# Patient Record
Sex: Male | Born: 2000 | Race: Black or African American | Hispanic: No | Marital: Single | Smoking: Never smoker
Health system: Southern US, Community
[De-identification: ages and names within clinical notes are randomized; demographics above are authoritative.]

## PROBLEM LIST (undated history)

## (undated) DIAGNOSIS — J302 Other seasonal allergic rhinitis: Secondary | ICD-10-CM

---
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
## 2022-04-12 ENCOUNTER — Emergency Department (HOSPITAL_BASED_OUTPATIENT_CLINIC_OR_DEPARTMENT_OTHER): Payer: 59

## 2022-04-12 ENCOUNTER — Emergency Department (HOSPITAL_BASED_OUTPATIENT_CLINIC_OR_DEPARTMENT_OTHER)
Admission: EM | Admit: 2022-04-12 | Discharge: 2022-04-12 | Disposition: A | Discharge status: Home / Self Care | Payer: 59 | Attending: Emergency Medicine | Admitting: Emergency Medicine

## 2022-04-12 ENCOUNTER — Encounter (HOSPITAL_BASED_OUTPATIENT_CLINIC_OR_DEPARTMENT_OTHER): Payer: Self-pay | Admitting: Emergency Medicine

## 2022-04-12 DIAGNOSIS — Y9361 Activity, american tackle football: Secondary | ICD-10-CM | POA: Diagnosis not present

## 2022-04-12 DIAGNOSIS — S63297A Dislocation of distal interphalangeal joint of left little finger, initial encounter: Secondary | ICD-10-CM | POA: Diagnosis not present

## 2022-04-12 DIAGNOSIS — S6992XA Unspecified injury of left wrist, hand and finger(s), initial encounter: Secondary | ICD-10-CM | POA: Diagnosis present

## 2022-04-12 DIAGNOSIS — W2101XA Struck by football, initial encounter: Secondary | ICD-10-CM | POA: Diagnosis not present

## 2022-04-12 DIAGNOSIS — S63259A Unspecified dislocation of unspecified finger, initial encounter: Secondary | ICD-10-CM

## 2022-04-12 MED ORDER — LIDOCAINE HCL 2 % IJ SOLN
20.0000 mL | Freq: Once | INTRAMUSCULAR | Status: AC
  Administered 2022-04-12: 400 mg
  Filled 2022-04-12: qty 20

## 2022-04-12 NOTE — ED Triage Notes (Signed)
Pt injured L pinky finger while playing football today. Pt's L pinky has a deformity.

## 2022-04-12 NOTE — ED Provider Notes (Signed)
[facility] EMERGENCY DEPARTMENT Provider Note   CSN: [id number] Arrival date & time: 04/12/22  1239     History  Chief Complaint  Patient presents with   Finger Injury    Clifford Brooks is a 21 y.o. male who presents to the ED for evaluation of a left pinky injury that occurred all playing football today.  Patient states he was attempting to catch a ball and slammed into his finger.  Pain is rated 7 out of 10.  Deformity to the left pinky.  He denies numbness and tingling.  He has no other complaints.  HPI     Home Medications Prior to Admission medications   Not on File      Allergies    Patient has no known allergies.    Review of Systems   Review of Systems  Physical Exam Updated Vital Signs BP 115/68 (BP Location: Right Arm)   Pulse 80   Temp 98.5 F (36.9 C)   Resp 16   SpO2 99%  Physical Exam Vitals and nursing note reviewed.  Constitutional:      General: He is not in acute distress.    Appearance: He is not ill-appearing.  HENT:     Head: Atraumatic.  Eyes:     Conjunctiva/sclera: Conjunctivae normal.  Cardiovascular:     Rate and Rhythm: Normal rate and regular rhythm.     Pulses: Normal pulses.     Heart sounds: No murmur heard. Pulmonary:     Effort: Pulmonary effort is normal. No respiratory distress.     Breath sounds: Normal breath sounds.  Abdominal:     General: Abdomen is flat. There is no distension.     Palpations: Abdomen is soft.     Tenderness: There is no abdominal tenderness.  Musculoskeletal:        General: Normal range of motion.     Cervical back: Normal range of motion.     Comments: Left pinky with obvious deformity.  Brisk capillary refill.  Subjective sensation intact.  Skin:    General: Skin is warm and dry.     Capillary Refill: Capillary refill takes less than 2 seconds.  Neurological:     General: No focal deficit present.     Mental Status: He is alert.  Psychiatric:        Mood and Affect: Mood  normal.     ED Results / Procedures / Treatments   Labs (all labs ordered are listed, but only abnormal results are displayed) Labs Reviewed - No data to display  EKG None  Radiology DG Finger Little Left  Result Date: 04/12/2022 CLINICAL DATA:  Dislocation, postreduction. EXAM: LEFT LITTLE FINGER 2+V COMPARISON:  Pre reduction radiograph earlier today. FINDINGS: Previous proximal interphalangeal joint dislocation has been reduced, alignment is anatomic. No fracture is seen. The joint spaces are normal. Mild soft tissue edema. IMPRESSION: Reduction of previous proximal interphalangeal joint dislocation, now in anatomic alignment. No fracture. Electronically Signed   By: Narda Rutherford M.D.   On: 04/12/2022 14:59   DG Finger Little Left  Result Date: 04/12/2022 CLINICAL DATA:  Finger injury EXAM: LEFT LITTLE FINGER 2+V COMPARISON:  None Available. FINDINGS: Dorsal lateral dislocation of the fifth middle phalanx relative to the proximal phalanx. No acute fracture visualized. Soft tissue swelling. IMPRESSION: Dislocation of the fifth PIP joint. Electronically Signed   By: Jannifer Hick M.D.   On: 04/12/2022 13:19    Procedures .Ortho Injury Treatment  Date/Time: 04/12/2022 4:52 PM  Performed by: Janell Quiet, PA-C Authorized by: Janell Quiet, PA-C   Consent:    Consent obtained:  Verbal   Consent given by:  Patient   Risks discussed:  Irreducible dislocation   Alternatives discussed:  No treatmentInjury location: finger Location details: left little finger Injury type: dislocation Dislocation type: PIP Pre-procedure neurovascular assessment: neurovascularly intact Pre-procedure distal perfusion: normal Pre-procedure neurological function: normal Pre-procedure range of motion: reduced Anesthesia: digital block  Anesthesia: Local anesthesia used: yes Local Anesthetic: lidocaine 2% without epinephrine Anesthetic total: 5 mL  Patient sedated: NoManipulation  performed: yes Reduction successful: yes X-ray confirmed reduction: yes Immobilization: splint Splint type: static finger Splint Applied by: ED Nurse Supplies used: aluminum splint Post-procedure neurovascular assessment: post-procedure neurovascularly intact Post-procedure distal perfusion: normal Post-procedure neurological function: normal Post-procedure range of motion: improved       Medications Ordered in ED Medications  lidocaine (XYLOCAINE) 2 % (with pres) injection 400 mg (400 mg Other Given 04/12/22 1522)    ED Course/ Medical Decision Making/ A&P                           Medical Decision Making Amount and/or Complexity of Data Reviewed Radiology: ordered.  Risk Prescription drug management.   21 year old male presents to the ED for evaluation of a left pinky injury that occurred after playing football.  Differentials include fracture, dislocation, sprain.  Vitals are without significant abnormality.  On exam, patient's left pinky has lateral angulation at the PIP joint.  Brisk capillary refill, neurovascularly intact.  I ordered interpreted x-ray which shows dislocation of the left pinky at the PIP joint.  I agree with radiologist interpretation.  I injected digital block into the left little finger and manipulated finger back into alignment.  Confirmed with x-ray.  Patient placed in static splint.  He has care already established with [facility].  Instructed to call them tomorrow for reevaluation.  I advised that he refrain from returning to football until he is cleared by orthopedics.  Patient expresses understanding is amenable to plan.  Discharged home in good condition. Final Clinical Impression(s) / ED Diagnoses Final diagnoses:  Dislocation of finger, initial encounter    Rx / DC Orders ED Discharge Orders     None         Janell Quiet, PA-C 04/12/22 1656    Alvira Monday, MD 04/12/22 2051

## 2022-04-12 NOTE — Discharge Instructions (Addendum)
Your x-ray showed no fracture of your finger although it was severely dislocated.  Fortunately, we were able to reduce it back into normal alignment.  I have given you a splint here for comfort.  Please ice 20 minutes on 20 minutes off for the next 48 hours.  Please call [facility] to set up an appointment for follow-up later this week.  Once you are seen, they can decide whether or not you can return to sports.  Tylenol or Motrin for pain.

## 2024-01-13 IMAGING — DX DG FINGER LITTLE 2+V*L*
3 series · 3 of 3 positions shown · non-contrast
Comparison: Pre reduction radiograph earlier today.

CLINICAL DATA: Dislocation, postreduction.

EXAM:
LEFT LITTLE FINGER 2+V

[finger pa]
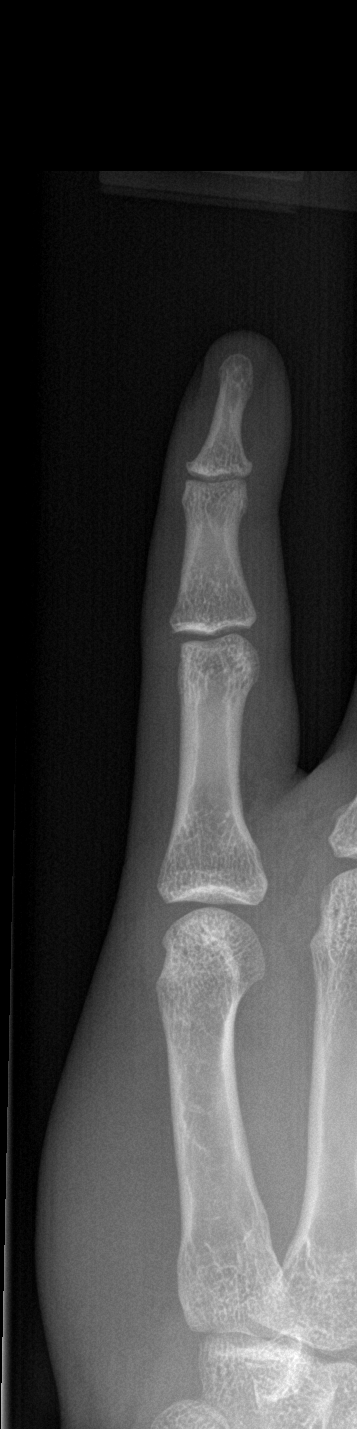

[finger obl]
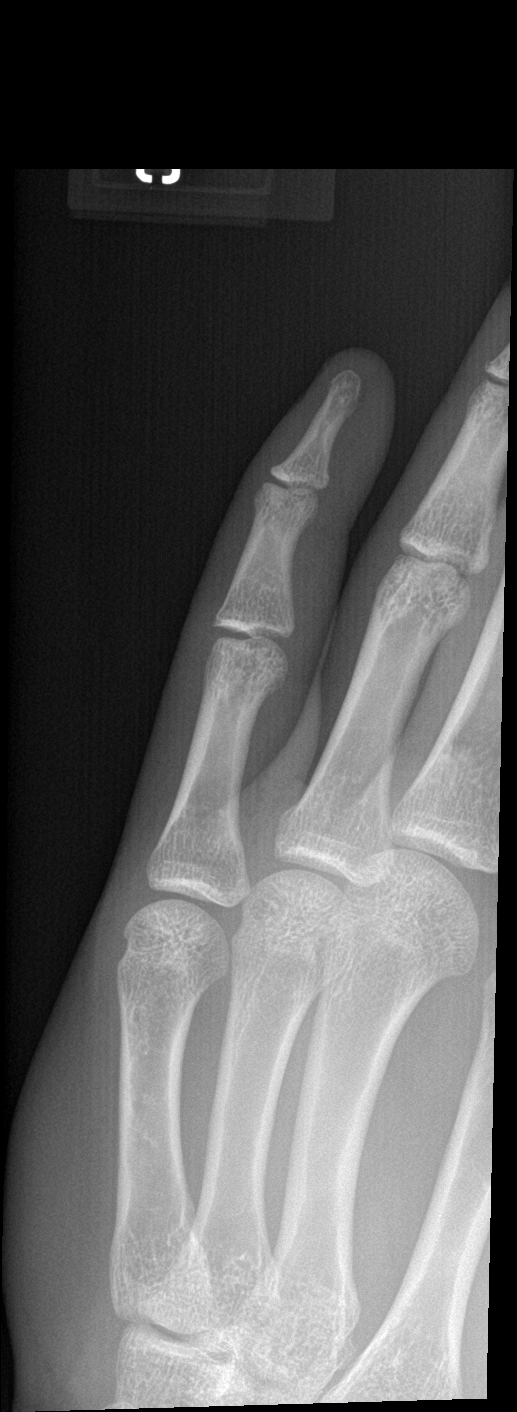

[finger lat]
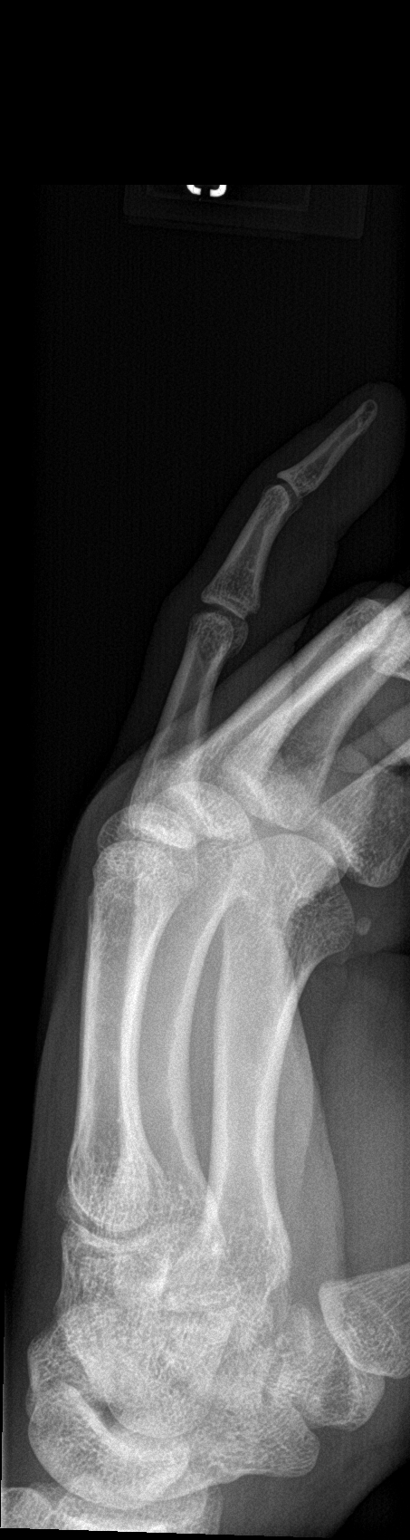

[3 of 3 positions shown; findings below may reference images not displayed]

FINDINGS: Previous proximal interphalangeal joint dislocation has been
reduced, alignment is anatomic. No fracture is seen. The joint
spaces are normal. Mild soft tissue edema.
IMPRESSION: Reduction of previous proximal interphalangeal joint dislocation,
now in anatomic alignment. No fracture.
